# Patient Record
Sex: Female | Born: 1935 | Hispanic: No | Smoking: Never smoker
Health system: Southern US, Community
[De-identification: ages and names within clinical notes are randomized; demographics above are authoritative.]

## PROBLEM LIST (undated history)

## (undated) DIAGNOSIS — M329 Systemic lupus erythematosus, unspecified: Secondary | ICD-10-CM

## (undated) DIAGNOSIS — E785 Hyperlipidemia, unspecified: Secondary | ICD-10-CM

## (undated) DIAGNOSIS — Z950 Presence of cardiac pacemaker: Secondary | ICD-10-CM

## (undated) DIAGNOSIS — I4891 Unspecified atrial fibrillation: Secondary | ICD-10-CM

## (undated) DIAGNOSIS — E049 Nontoxic goiter, unspecified: Secondary | ICD-10-CM

## (undated) DIAGNOSIS — R609 Edema, unspecified: Secondary | ICD-10-CM

## (undated) DIAGNOSIS — D649 Anemia, unspecified: Secondary | ICD-10-CM

## (undated) DIAGNOSIS — I10 Essential (primary) hypertension: Secondary | ICD-10-CM

## (undated) DIAGNOSIS — M109 Gout, unspecified: Secondary | ICD-10-CM

## (undated) DIAGNOSIS — N189 Chronic kidney disease, unspecified: Secondary | ICD-10-CM

## (undated) HISTORY — PX: ABDOMINAL HYSTERECTOMY: SHX81

## (undated) HISTORY — PX: PACEMAKER INSERTION: SHX728

## (undated) HISTORY — PX: JOINT REPLACEMENT: SHX530

---

## 2004-12-25 ENCOUNTER — Emergency Department: Payer: Self-pay | Admitting: Unknown Physician Specialty

## 2009-04-09 ENCOUNTER — Emergency Department: Payer: Self-pay | Admitting: Emergency Medicine

## 2009-04-09 ENCOUNTER — Ambulatory Visit: Payer: Self-pay | Admitting: Family Medicine

## 2009-04-12 ENCOUNTER — Ambulatory Visit: Payer: Self-pay | Admitting: Cardiovascular Disease

## 2009-04-13 ENCOUNTER — Inpatient Hospital Stay: Payer: Self-pay | Admitting: Internal Medicine

## 2013-04-30 DIAGNOSIS — Z950 Presence of cardiac pacemaker: Secondary | ICD-10-CM | POA: Diagnosis not present

## 2013-06-19 DIAGNOSIS — D649 Anemia, unspecified: Secondary | ICD-10-CM | POA: Diagnosis not present

## 2013-06-19 DIAGNOSIS — E785 Hyperlipidemia, unspecified: Secondary | ICD-10-CM | POA: Diagnosis not present

## 2013-06-19 DIAGNOSIS — I10 Essential (primary) hypertension: Secondary | ICD-10-CM | POA: Diagnosis not present

## 2013-06-19 DIAGNOSIS — N289 Disorder of kidney and ureter, unspecified: Secondary | ICD-10-CM | POA: Diagnosis not present

## 2013-06-19 DIAGNOSIS — I442 Atrioventricular block, complete: Secondary | ICD-10-CM | POA: Diagnosis not present

## 2013-07-31 DIAGNOSIS — I495 Sick sinus syndrome: Secondary | ICD-10-CM | POA: Diagnosis not present

## 2013-09-25 DIAGNOSIS — Z1239 Encounter for other screening for malignant neoplasm of breast: Secondary | ICD-10-CM | POA: Diagnosis not present

## 2013-09-25 DIAGNOSIS — Z862 Personal history of diseases of the blood and blood-forming organs and certain disorders involving the immune mechanism: Secondary | ICD-10-CM | POA: Diagnosis not present

## 2013-09-25 DIAGNOSIS — Z23 Encounter for immunization: Secondary | ICD-10-CM | POA: Diagnosis not present

## 2013-09-25 DIAGNOSIS — I10 Essential (primary) hypertension: Secondary | ICD-10-CM | POA: Diagnosis not present

## 2013-09-25 DIAGNOSIS — Z1329 Encounter for screening for other suspected endocrine disorder: Secondary | ICD-10-CM | POA: Diagnosis not present

## 2013-09-25 DIAGNOSIS — Z79899 Other long term (current) drug therapy: Secondary | ICD-10-CM | POA: Diagnosis not present

## 2013-09-25 DIAGNOSIS — D7589 Other specified diseases of blood and blood-forming organs: Secondary | ICD-10-CM | POA: Diagnosis not present

## 2013-09-25 DIAGNOSIS — Z8639 Personal history of other endocrine, nutritional and metabolic disease: Secondary | ICD-10-CM | POA: Diagnosis not present

## 2013-09-25 DIAGNOSIS — Z9189 Other specified personal risk factors, not elsewhere classified: Secondary | ICD-10-CM | POA: Diagnosis not present

## 2013-09-25 DIAGNOSIS — Z130 Encounter for screening for diseases of the blood and blood-forming organs and certain disorders involving the immune mechanism: Secondary | ICD-10-CM | POA: Diagnosis not present

## 2013-09-25 DIAGNOSIS — Z0000 Encounter for general adult medical examination without abnormal findings: Secondary | ICD-10-CM | POA: Diagnosis not present

## 2013-10-14 DIAGNOSIS — M216X9 Other acquired deformities of unspecified foot: Secondary | ICD-10-CM | POA: Diagnosis not present

## 2013-10-14 DIAGNOSIS — E119 Type 2 diabetes mellitus without complications: Secondary | ICD-10-CM | POA: Diagnosis not present

## 2013-10-29 DIAGNOSIS — Z950 Presence of cardiac pacemaker: Secondary | ICD-10-CM | POA: Diagnosis not present

## 2013-11-20 DIAGNOSIS — Z1231 Encounter for screening mammogram for malignant neoplasm of breast: Secondary | ICD-10-CM | POA: Diagnosis not present

## 2013-11-26 DIAGNOSIS — Z23 Encounter for immunization: Secondary | ICD-10-CM | POA: Diagnosis not present

## 2013-12-28 DIAGNOSIS — D7589 Other specified diseases of blood and blood-forming organs: Secondary | ICD-10-CM | POA: Diagnosis not present

## 2013-12-28 DIAGNOSIS — E784 Other hyperlipidemia: Secondary | ICD-10-CM | POA: Diagnosis not present

## 2013-12-28 DIAGNOSIS — N189 Chronic kidney disease, unspecified: Secondary | ICD-10-CM | POA: Diagnosis not present

## 2013-12-28 DIAGNOSIS — I10 Essential (primary) hypertension: Secondary | ICD-10-CM | POA: Diagnosis not present

## 2014-02-10 DIAGNOSIS — Z950 Presence of cardiac pacemaker: Secondary | ICD-10-CM | POA: Diagnosis not present

## 2014-04-26 DIAGNOSIS — D649 Anemia, unspecified: Secondary | ICD-10-CM | POA: Diagnosis not present

## 2014-04-26 DIAGNOSIS — I10 Essential (primary) hypertension: Secondary | ICD-10-CM | POA: Diagnosis not present

## 2014-04-26 DIAGNOSIS — E049 Nontoxic goiter, unspecified: Secondary | ICD-10-CM | POA: Diagnosis not present

## 2014-04-26 DIAGNOSIS — E784 Other hyperlipidemia: Secondary | ICD-10-CM | POA: Diagnosis not present

## 2014-04-26 DIAGNOSIS — Z13228 Encounter for screening for other metabolic disorders: Secondary | ICD-10-CM | POA: Diagnosis not present

## 2014-04-26 DIAGNOSIS — Z79899 Other long term (current) drug therapy: Secondary | ICD-10-CM | POA: Diagnosis not present

## 2014-04-26 DIAGNOSIS — Z0000 Encounter for general adult medical examination without abnormal findings: Secondary | ICD-10-CM | POA: Diagnosis not present

## 2014-05-17 DIAGNOSIS — Z950 Presence of cardiac pacemaker: Secondary | ICD-10-CM | POA: Diagnosis not present

## 2014-08-25 DIAGNOSIS — Z950 Presence of cardiac pacemaker: Secondary | ICD-10-CM | POA: Diagnosis not present

## 2014-11-11 DIAGNOSIS — N183 Chronic kidney disease, stage 3 (moderate): Secondary | ICD-10-CM | POA: Diagnosis not present

## 2014-11-16 DIAGNOSIS — N184 Chronic kidney disease, stage 4 (severe): Secondary | ICD-10-CM | POA: Diagnosis not present

## 2014-11-30 DIAGNOSIS — I495 Sick sinus syndrome: Secondary | ICD-10-CM | POA: Diagnosis not present

## 2015-02-08 DIAGNOSIS — N184 Chronic kidney disease, stage 4 (severe): Secondary | ICD-10-CM | POA: Diagnosis not present

## 2015-03-27 ENCOUNTER — Ambulatory Visit
Admission: EM | Admit: 2015-03-27 | Discharge: 2015-03-27 | Disposition: A | Discharge status: Home / Self Care | Payer: Medicare Other | Attending: Family Medicine | Admitting: Family Medicine

## 2015-03-27 ENCOUNTER — Ambulatory Visit: Payer: Medicare Other

## 2015-03-27 DIAGNOSIS — Z7982 Long term (current) use of aspirin: Secondary | ICD-10-CM | POA: Insufficient documentation

## 2015-03-27 DIAGNOSIS — I509 Heart failure, unspecified: Secondary | ICD-10-CM | POA: Insufficient documentation

## 2015-03-27 DIAGNOSIS — R0602 Shortness of breath: Secondary | ICD-10-CM | POA: Diagnosis present

## 2015-03-27 DIAGNOSIS — I4891 Unspecified atrial fibrillation: Secondary | ICD-10-CM | POA: Diagnosis not present

## 2015-03-27 DIAGNOSIS — I517 Cardiomegaly: Secondary | ICD-10-CM | POA: Diagnosis not present

## 2015-03-27 DIAGNOSIS — Z79899 Other long term (current) drug therapy: Secondary | ICD-10-CM | POA: Insufficient documentation

## 2015-03-27 DIAGNOSIS — J209 Acute bronchitis, unspecified: Secondary | ICD-10-CM | POA: Diagnosis not present

## 2015-03-27 HISTORY — DX: Unspecified atrial fibrillation: I48.91

## 2015-03-27 MED ORDER — AZITHROMYCIN 500 MG PO TABS: ORAL_TABLET | ORAL | Status: AC

## 2015-03-27 MED ORDER — IPRATROPIUM-ALBUTEROL 0.5-2.5 (3) MG/3ML IN SOLN
3.0000 mL | Freq: Once | RESPIRATORY_TRACT | Status: AC
  Administered 2015-03-27: 3 mL via RESPIRATORY_TRACT

## 2015-03-27 MED ORDER — FUROSEMIDE 20 MG PO TABS: 20.0000 mg | ORAL_TABLET | Freq: Every day | ORAL | Status: AC

## 2015-03-27 MED ORDER — ALBUTEROL SULFATE HFA 108 (90 BASE) MCG/ACT IN AERS: 2.0000 | INHALATION_SPRAY | RESPIRATORY_TRACT | Status: AC | PRN

## 2015-03-27 NOTE — ED Notes (Signed)
Patient complains of wheezing that she reports started earlier this week. She states that she has been noticing some chest heaviness. She states that she is not currently having any other symptoms. Patient does currently have a Psychologist, forensic.

## 2015-03-27 NOTE — Discharge Instructions (Signed)
Acute Bronchitis Bronchitis is when the airways that extend from the windpipe into the lungs get red, puffy, and painful (inflamed). Bronchitis often causes thick spit (mucus) to develop. This leads to a cough. A cough is the most common symptom of bronchitis. In acute bronchitis, the condition usually begins suddenly and goes away over time (usually in 2 weeks). Smoking, allergies, and asthma can make bronchitis worse. Repeated episodes of bronchitis may cause more lung problems. HOME CARE  Rest.  Drink enough fluids to keep your pee (urine) clear or pale yellow (unless you need to limit fluids as told by your doctor).  Only take over-the-counter or prescription medicines as told by your doctor.  Avoid smoking and secondhand smoke. These can make bronchitis worse. If you are a smoker, think about using nicotine gum or skin patches. Quitting smoking will help your lungs heal faster.  Reduce the chance of getting bronchitis again by:  Washing your hands often.  Avoiding people with cold symptoms.  Trying not to touch your hands to your mouth, nose, or eyes.  Follow up with your doctor as told. GET HELP IF: Your symptoms do not improve after 1 week of treatment. Symptoms include:  Cough.  Fever.  Coughing up thick spit.  Body aches.  Chest congestion.  Chills.  Shortness of breath.  Sore throat. GET HELP RIGHT AWAY IF:   You have an increased fever.  You have chills.  You have severe shortness of breath.  You have bloody thick spit (sputum).  You throw up (vomit) often.  You lose too much body fluid (dehydration).  You have a severe headache.  You faint. MAKE SURE YOU:   Understand these instructions.  Will watch your condition.  Will get help right away if you are not doing well or get worse.   This information is not intended to replace advice given to you by your health care provider. Make sure you discuss any questions you have with your health care  provider.   Document Released: 07/11/2007 Document Revised: 09/24/2012 Document Reviewed: 07/15/2012 Elsevier Interactive Patient Education 2016 [place].  Heart Failure Heart failure means your heart has trouble pumping blood. This makes it hard for your body to work well. Heart failure is usually a long-term (chronic) condition. You must take good care of yourself and follow your doctor's treatment plan. HOME CARE  Take your heart medicine as told by your doctor.  Do not stop taking medicine unless your doctor tells you to.  Do not skip any dose of medicine.  Refill your medicines before they run out.  Take other medicines only as told by your doctor or pharmacist.  Stay active if told by your doctor. The elderly and people with severe heart failure should talk with a doctor about physical activity.  Eat heart-healthy foods. Choose foods that are without trans fat and are low in saturated fat, cholesterol, and salt (sodium). This includes fresh or frozen fruits and vegetables, fish, lean meats, fat-free or low-fat dairy foods, whole grains, and high-fiber foods. Lentils and dried peas and beans (legumes) are also good choices.  Limit salt if told by your doctor.  Cook in a healthy way. Roast, grill, broil, bake, poach, steam, or stir-fry foods.  Limit fluids as told by your doctor.  Weigh yourself every morning. Do this after you pee (urinate) and before you eat breakfast. Write down your weight to give to your doctor.  Take your blood pressure and write it down if your doctor  tells you to.  Ask your doctor how to check your pulse. Check your pulse as told.  Lose weight if told by your doctor.  Stop smoking or chewing tobacco. Do not use gum or patches that help you quit without your doctor's approval.  Schedule and go to doctor visits as told.  Nonpregnant women should have no more than 1 drink a day. Men should have no more than 2 drinks a day. Talk to your doctor  about drinking alcohol.  Stop illegal drug use.  Stay current with shots (immunizations).  Manage your health conditions as told by your doctor.  Learn to manage your stress.  Rest when you are tired.  If it is really hot outside:  Avoid intense activities.  Use air conditioning or fans, or get in a cooler place.  Avoid caffeine and alcohol.  Wear loose-fitting, lightweight, and light-colored clothing.  If it is really cold outside:  Avoid intense activities.  Layer your clothing.  Wear mittens or gloves, a hat, and a scarf when going outside.  Avoid alcohol.  Learn about heart failure and get support as needed.  Get help to maintain or improve your quality of life and your ability to care for yourself as needed. GET HELP IF:   You gain weight quickly.  You are more short of breath than usual.  You cannot do your normal activities.  You tire easily.  You cough more than normal, especially with activity.  You have any or more puffiness (swelling) in areas such as your hands, feet, ankles, or belly (abdomen).  You cannot sleep because it is hard to breathe.  You feel like your heart is beating fast (palpitations).  You get dizzy or light-headed when you stand up. GET HELP RIGHT AWAY IF:   You have trouble breathing.  There is a change in mental status, such as becoming less alert or not being able to focus.  You have chest pain or discomfort.  You faint. MAKE SURE YOU:   Understand these instructions.  Will watch your condition.  Will get help right away if you are not doing well or get worse.   This information is not intended to replace advice given to you by your health care provider. Make sure you discuss any questions you have with your health care provider.   Document Released: 11/01/2007 Document Revised: 02/12/2014 Document Reviewed: 03/10/2012 Elsevier Interactive Patient Education [employer].

## 2015-03-27 NOTE — ED Provider Notes (Signed)
CSN: [id number]     Arrival date & time 03/27/15  [id number] History   First MD Initiated Contact with Patient 03/27/15 [phone]    Nurses notes were reviewed. Chief Complaint  Patient presents with  . Wheezing    Individual is a 80 year old black female reports being short of breath over the last 6-7 days. She states Monday she started getting short of breath and has been progressively getting worse A lot worse Friday and has continued to get worse since that. She reports difficulty and bleeding getting about with her normal activity. She is does to have a history of COPD but is even though she says she never smoked without she used to smoke many years ago. She does have a history of CHF and she does have a pacemaker at this time. She reports some swelling in the legs but that's appears to be more of a long-term problem and also difficulty with her ambulation. She denies any chest pain but states that her breathing has since seems to be getting worse. She reports some wheezing and some coughing as well.   (Consider location/radiation/quality/duration/timing/severity/associated sxs/prior Treatment) Patient is a 80 y.o. female presenting with shortness of breath. The history is provided by the patient and a relative. No language interpreter was used.  Shortness of Breath Onset quality:  Gradual Duration:  6 days Progression:  Worsening (Last 2 days or since Friday) Chronicity:  Recurrent Context: activity   Context: not smoke exposure and not strong odors   Relieved by:  Nothing Ineffective treatments:  None tried Associated symptoms: cough and wheezing     Past Medical History  Diagnosis Date  . Atrial fibrillation [facility])    Past Surgical History  Procedure Laterality Date  . Pacemaker insertion    . Abdominal hysterectomy     History reviewed. No pertinent family history. Social History  Substance Use Topics  . Smoking status: Never Smoker   . Smokeless tobacco: None  . Alcohol Use: No    OB History    No data available     Review of Systems  Constitutional: Positive for fatigue.  Respiratory: Positive for cough, shortness of breath and wheezing.   Musculoskeletal: Positive for joint swelling and gait problem.  All other systems reviewed and are negative.   Allergies  Review of patient's allergies indicates no known allergies.  Home Medications   Prior to Admission medications   Medication Sig Start Date End Date Taking? Authorizing Provider  allopurinol (ZYLOPRIM) 100 MG tablet Take 100 mg by mouth daily.   Yes Historical Provider, MD  amLODipine (NORVASC) 10 MG tablet Take 10 mg by mouth daily.   Yes Historical Provider, MD  aspirin EC 81 MG tablet Take 81 mg by mouth daily.   Yes Historical Provider, MD  Calcium Carbonate-Vit D-Min (CALCIUM 600 + MINERALS PO) Take by mouth.   Yes Historical Provider, MD  carvedilol (COREG) 6.25 MG tablet Take 6.25 mg by mouth 2 (two) times daily with a meal.   Yes Historical Provider, MD  diltiazem (DILACOR XR) 240 MG 24 hr capsule Take 240 mg by mouth daily.   Yes Historical Provider, MD  pravastatin (PRAVACHOL) 20 MG tablet Take 20 mg by mouth daily.   Yes Historical Provider, MD  albuterol (PROVENTIL HFA;VENTOLIN HFA) 108 (90 Base) MCG/ACT inhaler Inhale 2 puffs into the lungs every 4 (four) hours as needed for wheezing or shortness of breath. 03/27/15   Frederich Cha, MD  azithromycin (ZITHROMAX) 500 MG tablet One tablet  a day x 5 days 03/27/15   Frederich Cha, MD  furosemide (LASIX) 20 MG tablet Take 1 tablet (20 mg total) by mouth daily. 03/27/15   Frederich Cha, MD   Meds Ordered and Administered this Visit   Medications  ipratropium-albuterol (DUONEB) 0.5-2.5 (3) MG/3ML nebulizer solution 3 mL (3 mLs Nebulization Given 03/27/15 0900)    BP 160/55 mmHg  Pulse 64  Temp(Src) 97.4 F (36.3 C) (Oral)  Resp 15  Ht 5\' 7"  (1.702 m)  Wt 219 lb (99.338 kg)  BMI 34.29 kg/m2  SpO2 93% No data found.   Physical Exam   Constitutional: She is oriented to person, place, and time. She appears well-developed and well-nourished.  Some mild distress and discomfort which he tries to be active she is resting comfortably sitting in the chair  HENT:  Head: Atraumatic. Macrocephalic.  Right Ear: Hearing, tympanic membrane and external ear normal.  Left Ear: Hearing, tympanic membrane and external ear normal.  Eyes: Conjunctivae are normal. Pupils are equal, round, and reactive to light.  Neck: Normal range of motion. Neck supple.  Cardiovascular: Exam reveals distant heart sounds.   Pulmonary/Chest: Effort normal. No respiratory distress. She has decreased breath sounds. She has no wheezes.  Musculoskeletal: Normal range of motion.  Neurological: She is alert and oriented to person, place, and time.  Skin: Skin is warm and dry.  Psychiatric: She has a normal mood and affect.  Vitals reviewed.   ED Course  Procedures (including critical care time)  Labs Review Labs Reviewed - No data to display  Imaging Review Dg Chest 2 View  03/27/2015  CLINICAL DATA:  Short of breath for 4 days. History of hypertension, lupus and kidney disease. EXAM: CHEST  2 VIEW COMPARISON:  04/11/2009 FINDINGS: The cardiac silhouette is moderately enlarged. No mediastinal or hilar masses or convincing adenopathy. Mild interstitial prominence with thickening of the fissures. There is some atelectasis or scarring extending laterally from the left hilum. Mild lung base atelectasis. No evidence of pneumonia. No pleural effusion or pneumothorax. Left anterior chest wall single lead pacemaker is well positioned, new since prior exam. There is a mild compression fracture of a lower thoracic vertebra. IMPRESSION: 1. Cardiomegaly with mild interstitial prominence and thickening of the fissures. Mild congestive heart failure suspected. No evidence of pneumonia. Electronically Signed   By: Lajean Manes M.D.   On: 03/27/2015 09:38     Visual Acuity  Review  Right Eye Distance:   Left Eye Distance:   Bilateral Distance:    Right Eye Near:   Left Eye Near:    Bilateral Near:        MDM   1. Bronchitis, acute, with bronchospasm   2. Acute congestive heart failure, unspecified congestive heart failure type ([place])     DuoNeb will be given and chest x-ray will be ordered as well  Patient improved greatly after DuoNeb treatment was given. Chest x-ray showed possible early mild heart failure. At this time appears patient has combination of URI with acute bronchospasms/bronchitis and mild congestive heart failure. She does have a history of heart failure so we're going to place her on Lasix she used to be on basis apparently several years ago. Will give her 2 weeks of Lasix 20 mg 1 tablet a day but she'll need to call her PCP on Monday or Tuesday to be seen and to be evaluated for how long she should take the Lasix medication. For the acute URI/bronchitis with bronchospasm I'm placing her  on Zithromax for 5 days 500 mg and will going to give her an albuterol inhaler since she did respond to the DuoNeb treatment and her history is consistent with a URI. Gone over this twice with her and hopefully to prevent instructions will help her understanding unfortunate time of discharge his symptoms and longer in the room     Note: This dictation was prepared with Dragon dictation along with smaller phrase technology. Any transcriptional errors that result from this process are unintentional.  Frederich Cha, MD 03/27/15 1022

## 2015-04-12 DIAGNOSIS — N184 Chronic kidney disease, stage 4 (severe): Secondary | ICD-10-CM | POA: Diagnosis not present

## 2015-04-12 DIAGNOSIS — I129 Hypertensive chronic kidney disease with stage 1 through stage 4 chronic kidney disease, or unspecified chronic kidney disease: Secondary | ICD-10-CM | POA: Diagnosis not present

## 2015-05-03 DIAGNOSIS — D509 Iron deficiency anemia, unspecified: Secondary | ICD-10-CM | POA: Diagnosis not present

## 2015-05-03 DIAGNOSIS — D649 Anemia, unspecified: Secondary | ICD-10-CM | POA: Diagnosis not present

## 2015-05-26 DIAGNOSIS — I495 Sick sinus syndrome: Secondary | ICD-10-CM | POA: Diagnosis not present

## 2015-05-26 DIAGNOSIS — N185 Chronic kidney disease, stage 5: Secondary | ICD-10-CM | POA: Diagnosis not present

## 2015-06-02 DIAGNOSIS — N185 Chronic kidney disease, stage 5: Secondary | ICD-10-CM | POA: Diagnosis not present

## 2015-06-02 DIAGNOSIS — D631 Anemia in chronic kidney disease: Secondary | ICD-10-CM | POA: Diagnosis not present

## 2015-06-25 ENCOUNTER — Encounter: Payer: Self-pay | Admitting: Gynecology

## 2015-06-25 ENCOUNTER — Ambulatory Visit
Admission: EM | Admit: 2015-06-25 | Discharge: 2015-06-25 | Disposition: A | Discharge status: Home / Self Care | Payer: Medicare Other | Attending: Family Medicine | Admitting: Family Medicine

## 2015-06-25 ENCOUNTER — Ambulatory Visit: Payer: Medicare Other

## 2015-06-25 DIAGNOSIS — D649 Anemia, unspecified: Secondary | ICD-10-CM | POA: Diagnosis not present

## 2015-06-25 DIAGNOSIS — I509 Heart failure, unspecified: Secondary | ICD-10-CM | POA: Insufficient documentation

## 2015-06-25 DIAGNOSIS — M109 Gout, unspecified: Secondary | ICD-10-CM | POA: Insufficient documentation

## 2015-06-25 DIAGNOSIS — E785 Hyperlipidemia, unspecified: Secondary | ICD-10-CM | POA: Insufficient documentation

## 2015-06-25 DIAGNOSIS — Z950 Presence of cardiac pacemaker: Secondary | ICD-10-CM | POA: Insufficient documentation

## 2015-06-25 DIAGNOSIS — I130 Hypertensive heart and chronic kidney disease with heart failure and stage 1 through stage 4 chronic kidney disease, or unspecified chronic kidney disease: Secondary | ICD-10-CM | POA: Insufficient documentation

## 2015-06-25 DIAGNOSIS — N189 Chronic kidney disease, unspecified: Secondary | ICD-10-CM | POA: Insufficient documentation

## 2015-06-25 DIAGNOSIS — I4430 Unspecified atrioventricular block: Secondary | ICD-10-CM | POA: Diagnosis not present

## 2015-06-25 DIAGNOSIS — M329 Systemic lupus erythematosus, unspecified: Secondary | ICD-10-CM | POA: Diagnosis not present

## 2015-06-25 DIAGNOSIS — R609 Edema, unspecified: Secondary | ICD-10-CM | POA: Diagnosis not present

## 2015-06-25 DIAGNOSIS — I10 Essential (primary) hypertension: Secondary | ICD-10-CM | POA: Diagnosis not present

## 2015-06-25 DIAGNOSIS — I4891 Unspecified atrial fibrillation: Secondary | ICD-10-CM | POA: Insufficient documentation

## 2015-06-25 DIAGNOSIS — R0602 Shortness of breath: Secondary | ICD-10-CM | POA: Diagnosis not present

## 2015-06-25 DIAGNOSIS — Z7982 Long term (current) use of aspirin: Secondary | ICD-10-CM | POA: Insufficient documentation

## 2015-06-25 DIAGNOSIS — R0600 Dyspnea, unspecified: Secondary | ICD-10-CM | POA: Diagnosis not present

## 2015-06-25 DIAGNOSIS — J439 Emphysema, unspecified: Secondary | ICD-10-CM | POA: Diagnosis not present

## 2015-06-25 DIAGNOSIS — E875 Hyperkalemia: Secondary | ICD-10-CM | POA: Diagnosis not present

## 2015-06-25 DIAGNOSIS — I5043 Acute on chronic combined systolic (congestive) and diastolic (congestive) heart failure: Secondary | ICD-10-CM | POA: Diagnosis not present

## 2015-06-25 DIAGNOSIS — J9601 Acute respiratory failure with hypoxia: Secondary | ICD-10-CM | POA: Diagnosis not present

## 2015-06-25 DIAGNOSIS — N184 Chronic kidney disease, stage 4 (severe): Secondary | ICD-10-CM | POA: Diagnosis not present

## 2015-06-25 DIAGNOSIS — E049 Nontoxic goiter, unspecified: Secondary | ICD-10-CM | POA: Diagnosis not present

## 2015-06-25 HISTORY — DX: Hyperlipidemia, unspecified: E78.5

## 2015-06-25 HISTORY — DX: Essential (primary) hypertension: I10

## 2015-06-25 HISTORY — DX: Systemic lupus erythematosus, unspecified: M32.9

## 2015-06-25 HISTORY — DX: Gout, unspecified: M10.9

## 2015-06-25 HISTORY — DX: Nontoxic goiter, unspecified: E04.9

## 2015-06-25 HISTORY — DX: Presence of cardiac pacemaker: Z95.0

## 2015-06-25 HISTORY — DX: Chronic kidney disease, unspecified: N18.9

## 2015-06-25 HISTORY — DX: Anemia, unspecified: D64.9

## 2015-06-25 HISTORY — DX: Edema, unspecified: R60.9

## 2015-06-25 NOTE — ED Notes (Signed)
Patient c/o shortness of breath when walking.

## 2015-06-25 NOTE — ED Provider Notes (Signed)
CSN: [id number]     Arrival date & time 06/25/15  1211 History   First MD Initiated Contact with Patient 06/25/15 1240     Chief Complaint  Patient presents with  . Shortness of Breath   (Consider location/radiation/quality/duration/timing/severity/associated sxs/prior Treatment) HPI: Patient presents today with symptoms of shortness of breath with walking. Patient states that she's had the symptoms since yesterday. She does have a history of CHF and CKD. She does have a pacemaker. She denies any chest pain or diaphoresis. She does have lower extremity edema that she feels is worse. She denies any calf tenderness. She denies any history of DVT or PE. She does state that her Lasix dose was decreased last month by her cardiologist.  Past Medical History  Diagnosis Date  . Atrial fibrillation ([place])   . Hypertension   . Anemia   . Gout   . CKD (chronic kidney disease)   . Lupus ([place])   . Hyperlipemia   . Goiter   . Edema   . Cardiac pacemaker    Past Surgical History  Procedure Laterality Date  . Pacemaker insertion    . Abdominal hysterectomy    . Joint replacement Right    No family history on file. Social History  Substance Use Topics  . Smoking status: Never Smoker   . Smokeless tobacco: None  . Alcohol Use: No   OB History    No data available     Review of Systems: Negative except mentioned above.   Allergies  Review of patient's allergies indicates no known allergies.  Home Medications   Prior to Admission medications   Medication Sig Start Date End Date Taking? Authorizing Provider  allopurinol (ZYLOPRIM) 100 MG tablet Take 100 mg by mouth daily.   Yes Historical Provider, MD  amLODipine (NORVASC) 10 MG tablet Take 10 mg by mouth daily.   Yes Historical Provider, MD  aspirin EC 81 MG tablet Take 81 mg by mouth daily.   Yes Historical Provider, MD  Calcium Carbonate-Vit D-Min (CALCIUM 600 + MINERALS PO) Take by mouth.   Yes Historical Provider, MD  carvedilol  (COREG) 6.25 MG tablet Take 6.25 mg by mouth 2 (two) times daily with a meal.   Yes Historical Provider, MD  diltiazem (DILACOR XR) 240 MG 24 hr capsule Take 240 mg by mouth daily.   Yes Historical Provider, MD  furosemide (LASIX) 20 MG tablet Take 1 tablet (20 mg total) by mouth daily. 03/27/15  Yes Frederich Cha, MD  pravastatin (PRAVACHOL) 20 MG tablet Take 20 mg by mouth daily.   Yes Historical Provider, MD  albuterol (PROVENTIL HFA;VENTOLIN HFA) 108 (90 Base) MCG/ACT inhaler Inhale 2 puffs into the lungs every 4 (four) hours as needed for wheezing or shortness of breath. 03/27/15   Frederich Cha, MD  azithromycin [facility]) 500 MG tablet One tablet a day x 5 days 03/27/15   Frederich Cha, MD   Meds Ordered and Administered this Visit  Medications - No data to display  BP 158/60 mmHg  Pulse 60  Temp(Src) 97.6 F (36.4 C) (Oral)  Resp 18  Ht 5\' 7"  (1.702 m)  Wt 219 lb (99.338 kg)  BMI 34.29 kg/m2  SpO2 97% No data found.   Physical Exam   GENERAL: NAD HEENT: no pharyngeal erythema, no exudate, no erythema of TMs, no cervical LAD RESP: slightly decreased breath sounds at bases, no wheezing appreciated, no accessory muscle use  CARD: irregular heart rate  EXTREM: +2 pitting edema lower extemities  bilaterally, -[place] NEURO: CN II-XII grossly intact   ED Course  Procedures (including critical care time)  Labs Review Labs Reviewed - No data to display  Imaging Review No results found.  ECG -pacemaker, no p waves, HR 60, agree with reading on ECG   MDM   A/P: Shortness of breath with history of CHF and CKD- discussed with patient that I would recommend at this time that she go to the ER for further evaluation and treatment. Patient will likely need labs including cardiac labs. Her chest x-ray did not show any acute process related to her CHF however I do feel that she probably needs adjustment in her Lasix given her current symptoms. Her kidney function and potassium will need to be  monitored to do this. Cardiac enzymes will likely need to be done as well. Patient addresses understanding of this and she has someone in the room with her to take her to the ER at this time. She states that she will be going to [facility] because her cardiologist is there.   Paulina Fusi, MD 06/25/15 1343

## 2015-06-26 DIAGNOSIS — D631 Anemia in chronic kidney disease: Secondary | ICD-10-CM | POA: Diagnosis present

## 2015-06-26 DIAGNOSIS — M329 Systemic lupus erythematosus, unspecified: Secondary | ICD-10-CM | POA: Diagnosis present

## 2015-06-26 DIAGNOSIS — E875 Hyperkalemia: Secondary | ICD-10-CM | POA: Diagnosis not present

## 2015-06-26 DIAGNOSIS — I480 Paroxysmal atrial fibrillation: Secondary | ICD-10-CM | POA: Diagnosis present

## 2015-06-26 DIAGNOSIS — Z862 Personal history of diseases of the blood and blood-forming organs and certain disorders involving the immune mechanism: Secondary | ICD-10-CM | POA: Diagnosis not present

## 2015-06-26 DIAGNOSIS — Z6834 Body mass index (BMI) 34.0-34.9, adult: Secondary | ICD-10-CM | POA: Diagnosis not present

## 2015-06-26 DIAGNOSIS — M109 Gout, unspecified: Secondary | ICD-10-CM | POA: Diagnosis present

## 2015-06-26 DIAGNOSIS — I130 Hypertensive heart and chronic kidney disease with heart failure and stage 1 through stage 4 chronic kidney disease, or unspecified chronic kidney disease: Secondary | ICD-10-CM | POA: Diagnosis present

## 2015-06-26 DIAGNOSIS — Z7982 Long term (current) use of aspirin: Secondary | ICD-10-CM | POA: Diagnosis not present

## 2015-06-26 DIAGNOSIS — Z90710 Acquired absence of both cervix and uterus: Secondary | ICD-10-CM | POA: Diagnosis not present

## 2015-06-26 DIAGNOSIS — E785 Hyperlipidemia, unspecified: Secondary | ICD-10-CM | POA: Diagnosis present

## 2015-06-26 DIAGNOSIS — N184 Chronic kidney disease, stage 4 (severe): Secondary | ICD-10-CM | POA: Diagnosis present

## 2015-06-26 DIAGNOSIS — R0602 Shortness of breath: Secondary | ICD-10-CM | POA: Diagnosis not present

## 2015-06-26 DIAGNOSIS — Z7901 Long term (current) use of anticoagulants: Secondary | ICD-10-CM | POA: Diagnosis not present

## 2015-06-26 DIAGNOSIS — J9601 Acute respiratory failure with hypoxia: Secondary | ICD-10-CM | POA: Diagnosis not present

## 2015-06-26 DIAGNOSIS — I5043 Acute on chronic combined systolic (congestive) and diastolic (congestive) heart failure: Secondary | ICD-10-CM | POA: Diagnosis present

## 2015-06-26 DIAGNOSIS — E049 Nontoxic goiter, unspecified: Secondary | ICD-10-CM | POA: Diagnosis present

## 2015-06-26 DIAGNOSIS — R609 Edema, unspecified: Secondary | ICD-10-CM | POA: Diagnosis not present

## 2015-06-26 DIAGNOSIS — I10 Essential (primary) hypertension: Secondary | ICD-10-CM | POA: Diagnosis not present

## 2015-06-26 DIAGNOSIS — I872 Venous insufficiency (chronic) (peripheral): Secondary | ICD-10-CM | POA: Diagnosis present

## 2015-06-26 DIAGNOSIS — I509 Heart failure, unspecified: Secondary | ICD-10-CM | POA: Diagnosis not present

## 2015-06-26 DIAGNOSIS — D649 Anemia, unspecified: Secondary | ICD-10-CM | POA: Diagnosis not present

## 2015-06-26 DIAGNOSIS — Z950 Presence of cardiac pacemaker: Secondary | ICD-10-CM | POA: Diagnosis not present

## 2015-06-26 DIAGNOSIS — D469 Myelodysplastic syndrome, unspecified: Secondary | ICD-10-CM | POA: Diagnosis present

## 2015-07-05 DIAGNOSIS — I129 Hypertensive chronic kidney disease with stage 1 through stage 4 chronic kidney disease, or unspecified chronic kidney disease: Secondary | ICD-10-CM | POA: Diagnosis not present

## 2015-07-05 DIAGNOSIS — N189 Chronic kidney disease, unspecified: Secondary | ICD-10-CM | POA: Diagnosis not present

## 2015-07-05 DIAGNOSIS — D638 Anemia in other chronic diseases classified elsewhere: Secondary | ICD-10-CM | POA: Diagnosis not present

## 2015-07-05 DIAGNOSIS — I5041 Acute combined systolic (congestive) and diastolic (congestive) heart failure: Secondary | ICD-10-CM | POA: Diagnosis not present

## 2015-07-05 DIAGNOSIS — N184 Chronic kidney disease, stage 4 (severe): Secondary | ICD-10-CM | POA: Diagnosis not present

## 2015-07-05 DIAGNOSIS — Z09 Encounter for follow-up examination after completed treatment for conditions other than malignant neoplasm: Secondary | ICD-10-CM | POA: Diagnosis not present

## 2015-07-05 DIAGNOSIS — I10 Essential (primary) hypertension: Secondary | ICD-10-CM | POA: Diagnosis not present

## 2015-07-12 DIAGNOSIS — I480 Paroxysmal atrial fibrillation: Secondary | ICD-10-CM | POA: Diagnosis not present

## 2015-07-12 DIAGNOSIS — I10 Essential (primary) hypertension: Secondary | ICD-10-CM | POA: Diagnosis not present

## 2015-07-12 DIAGNOSIS — N181 Chronic kidney disease, stage 1: Secondary | ICD-10-CM | POA: Diagnosis not present

## 2015-07-12 DIAGNOSIS — I5023 Acute on chronic systolic (congestive) heart failure: Secondary | ICD-10-CM | POA: Diagnosis not present

## 2015-08-02 DIAGNOSIS — R7989 Other specified abnormal findings of blood chemistry: Secondary | ICD-10-CM | POA: Diagnosis not present

## 2015-08-02 DIAGNOSIS — I120 Hypertensive chronic kidney disease with stage 5 chronic kidney disease or end stage renal disease: Secondary | ICD-10-CM | POA: Diagnosis not present

## 2015-08-02 DIAGNOSIS — N185 Chronic kidney disease, stage 5: Secondary | ICD-10-CM | POA: Diagnosis not present

## 2015-08-10 DIAGNOSIS — R7989 Other specified abnormal findings of blood chemistry: Secondary | ICD-10-CM | POA: Diagnosis not present

## 2015-08-19 DIAGNOSIS — I5043 Acute on chronic combined systolic (congestive) and diastolic (congestive) heart failure: Secondary | ICD-10-CM | POA: Diagnosis not present

## 2015-08-19 DIAGNOSIS — Z950 Presence of cardiac pacemaker: Secondary | ICD-10-CM | POA: Diagnosis not present

## 2015-08-19 DIAGNOSIS — I5023 Acute on chronic systolic (congestive) heart failure: Secondary | ICD-10-CM | POA: Diagnosis not present

## 2015-08-19 DIAGNOSIS — I4430 Unspecified atrioventricular block: Secondary | ICD-10-CM | POA: Diagnosis not present

## 2015-08-19 DIAGNOSIS — N181 Chronic kidney disease, stage 1: Secondary | ICD-10-CM | POA: Diagnosis not present

## 2015-08-19 DIAGNOSIS — I10 Essential (primary) hypertension: Secondary | ICD-10-CM | POA: Diagnosis not present

## 2015-08-19 DIAGNOSIS — I872 Venous insufficiency (chronic) (peripheral): Secondary | ICD-10-CM | POA: Diagnosis not present

## 2015-08-19 DIAGNOSIS — I480 Paroxysmal atrial fibrillation: Secondary | ICD-10-CM | POA: Diagnosis not present

## 2015-09-09 DIAGNOSIS — I4430 Unspecified atrioventricular block: Secondary | ICD-10-CM | POA: Diagnosis not present

## 2015-09-20 DIAGNOSIS — N185 Chronic kidney disease, stage 5: Secondary | ICD-10-CM | POA: Diagnosis not present

## 2015-09-21 DIAGNOSIS — D649 Anemia, unspecified: Secondary | ICD-10-CM | POA: Diagnosis not present

## 2015-10-07 DIAGNOSIS — Z1329 Encounter for screening for other suspected endocrine disorder: Secondary | ICD-10-CM | POA: Diagnosis not present

## 2015-10-07 DIAGNOSIS — Z0000 Encounter for general adult medical examination without abnormal findings: Secondary | ICD-10-CM | POA: Diagnosis not present

## 2015-10-07 DIAGNOSIS — E785 Hyperlipidemia, unspecified: Secondary | ICD-10-CM | POA: Diagnosis not present

## 2015-10-07 DIAGNOSIS — Z23 Encounter for immunization: Secondary | ICD-10-CM | POA: Diagnosis not present

## 2015-10-11 DIAGNOSIS — N184 Chronic kidney disease, stage 4 (severe): Secondary | ICD-10-CM | POA: Diagnosis not present

## 2015-10-14 DIAGNOSIS — N184 Chronic kidney disease, stage 4 (severe): Secondary | ICD-10-CM | POA: Diagnosis not present

## 2015-10-14 DIAGNOSIS — D649 Anemia, unspecified: Secondary | ICD-10-CM | POA: Diagnosis not present

## 2015-10-14 DIAGNOSIS — D631 Anemia in chronic kidney disease: Secondary | ICD-10-CM | POA: Diagnosis not present

## 2015-10-14 DIAGNOSIS — Z1231 Encounter for screening mammogram for malignant neoplasm of breast: Secondary | ICD-10-CM | POA: Diagnosis not present

## 2015-11-11 DIAGNOSIS — Z86010 Personal history of colonic polyps: Secondary | ICD-10-CM | POA: Diagnosis not present

## 2015-11-11 DIAGNOSIS — Z538 Procedure and treatment not carried out for other reasons: Secondary | ICD-10-CM | POA: Diagnosis not present

## 2015-11-11 DIAGNOSIS — I130 Hypertensive heart and chronic kidney disease with heart failure and stage 1 through stage 4 chronic kidney disease, or unspecified chronic kidney disease: Secondary | ICD-10-CM | POA: Diagnosis not present

## 2015-11-11 DIAGNOSIS — E785 Hyperlipidemia, unspecified: Secondary | ICD-10-CM | POA: Diagnosis not present

## 2015-11-11 DIAGNOSIS — Z1211 Encounter for screening for malignant neoplasm of colon: Secondary | ICD-10-CM | POA: Diagnosis not present

## 2015-11-11 DIAGNOSIS — Z79899 Other long term (current) drug therapy: Secondary | ICD-10-CM | POA: Diagnosis not present

## 2015-11-11 DIAGNOSIS — I509 Heart failure, unspecified: Secondary | ICD-10-CM | POA: Diagnosis not present

## 2015-11-11 DIAGNOSIS — Z7982 Long term (current) use of aspirin: Secondary | ICD-10-CM | POA: Diagnosis not present

## 2015-11-11 DIAGNOSIS — N189 Chronic kidney disease, unspecified: Secondary | ICD-10-CM | POA: Diagnosis not present

## 2015-12-09 DIAGNOSIS — I4430 Unspecified atrioventricular block: Secondary | ICD-10-CM | POA: Diagnosis not present

## 2016-01-06 DIAGNOSIS — I4891 Unspecified atrial fibrillation: Secondary | ICD-10-CM | POA: Diagnosis not present

## 2016-01-06 DIAGNOSIS — E049 Nontoxic goiter, unspecified: Secondary | ICD-10-CM | POA: Diagnosis not present

## 2016-01-06 DIAGNOSIS — D638 Anemia in other chronic diseases classified elsewhere: Secondary | ICD-10-CM | POA: Diagnosis not present

## 2016-01-06 DIAGNOSIS — I10 Essential (primary) hypertension: Secondary | ICD-10-CM | POA: Diagnosis not present

## 2016-01-06 DIAGNOSIS — E785 Hyperlipidemia, unspecified: Secondary | ICD-10-CM | POA: Diagnosis not present

## 2016-01-06 DIAGNOSIS — Z23 Encounter for immunization: Secondary | ICD-10-CM | POA: Diagnosis not present

## 2016-01-06 DIAGNOSIS — I129 Hypertensive chronic kidney disease with stage 1 through stage 4 chronic kidney disease, or unspecified chronic kidney disease: Secondary | ICD-10-CM | POA: Diagnosis not present

## 2016-01-06 DIAGNOSIS — N184 Chronic kidney disease, stage 4 (severe): Secondary | ICD-10-CM | POA: Diagnosis not present

## 2016-03-12 DIAGNOSIS — I5023 Acute on chronic systolic (congestive) heart failure: Secondary | ICD-10-CM | POA: Diagnosis not present

## 2016-03-13 DIAGNOSIS — I151 Hypertension secondary to other renal disorders: Secondary | ICD-10-CM | POA: Diagnosis not present

## 2016-03-13 DIAGNOSIS — N2889 Other specified disorders of kidney and ureter: Secondary | ICD-10-CM | POA: Diagnosis not present

## 2016-05-11 DIAGNOSIS — D638 Anemia in other chronic diseases classified elsewhere: Secondary | ICD-10-CM | POA: Diagnosis not present

## 2016-05-11 DIAGNOSIS — E049 Nontoxic goiter, unspecified: Secondary | ICD-10-CM | POA: Diagnosis not present

## 2016-05-11 DIAGNOSIS — D649 Anemia, unspecified: Secondary | ICD-10-CM | POA: Diagnosis not present

## 2016-05-11 DIAGNOSIS — E784 Other hyperlipidemia: Secondary | ICD-10-CM | POA: Diagnosis not present

## 2016-05-11 DIAGNOSIS — I4891 Unspecified atrial fibrillation: Secondary | ICD-10-CM | POA: Diagnosis not present

## 2016-05-11 DIAGNOSIS — I10 Essential (primary) hypertension: Secondary | ICD-10-CM | POA: Diagnosis not present

## 2016-07-10 DIAGNOSIS — N184 Chronic kidney disease, stage 4 (severe): Secondary | ICD-10-CM | POA: Diagnosis not present

## 2016-09-06 DIAGNOSIS — Z45018 Encounter for adjustment and management of other part of cardiac pacemaker: Secondary | ICD-10-CM | POA: Diagnosis not present

## 2016-09-06 DIAGNOSIS — I495 Sick sinus syndrome: Secondary | ICD-10-CM | POA: Diagnosis not present

## 2016-12-04 DIAGNOSIS — I129 Hypertensive chronic kidney disease with stage 1 through stage 4 chronic kidney disease, or unspecified chronic kidney disease: Secondary | ICD-10-CM | POA: Diagnosis not present

## 2016-12-04 DIAGNOSIS — N184 Chronic kidney disease, stage 4 (severe): Secondary | ICD-10-CM | POA: Diagnosis not present

## 2016-12-06 DIAGNOSIS — Z45018 Encounter for adjustment and management of other part of cardiac pacemaker: Secondary | ICD-10-CM | POA: Diagnosis not present

## 2016-12-06 DIAGNOSIS — I495 Sick sinus syndrome: Secondary | ICD-10-CM | POA: Diagnosis not present

## 2016-12-06 DIAGNOSIS — Z950 Presence of cardiac pacemaker: Secondary | ICD-10-CM | POA: Diagnosis not present

## 2016-12-14 DIAGNOSIS — N184 Chronic kidney disease, stage 4 (severe): Secondary | ICD-10-CM | POA: Diagnosis not present

## 2016-12-14 DIAGNOSIS — D631 Anemia in chronic kidney disease: Secondary | ICD-10-CM | POA: Diagnosis not present

## 2016-12-18 DIAGNOSIS — Z23 Encounter for immunization: Secondary | ICD-10-CM | POA: Diagnosis not present

## 2016-12-18 DIAGNOSIS — R200 Anesthesia of skin: Secondary | ICD-10-CM | POA: Diagnosis not present

## 2016-12-18 DIAGNOSIS — E139 Other specified diabetes mellitus without complications: Secondary | ICD-10-CM | POA: Diagnosis not present

## 2017-01-23 DIAGNOSIS — D631 Anemia in chronic kidney disease: Secondary | ICD-10-CM | POA: Diagnosis not present

## 2017-01-23 DIAGNOSIS — N184 Chronic kidney disease, stage 4 (severe): Secondary | ICD-10-CM | POA: Diagnosis not present

## 2017-01-25 DIAGNOSIS — N184 Chronic kidney disease, stage 4 (severe): Secondary | ICD-10-CM | POA: Diagnosis not present

## 2017-01-25 DIAGNOSIS — D631 Anemia in chronic kidney disease: Secondary | ICD-10-CM | POA: Diagnosis not present

## 2018-01-05 DEATH — deceased

## 2018-02-06 IMAGING — CR DG CHEST 2V
3 series · 3 of 3 positions shown · non-contrast
Comparison: 04/11/2009

CLINICAL DATA: Short of breath for 4 days. History of hypertension,
lupus and kidney disease.

EXAM:
CHEST  2 VIEW

[chest lat (1 of 2)]
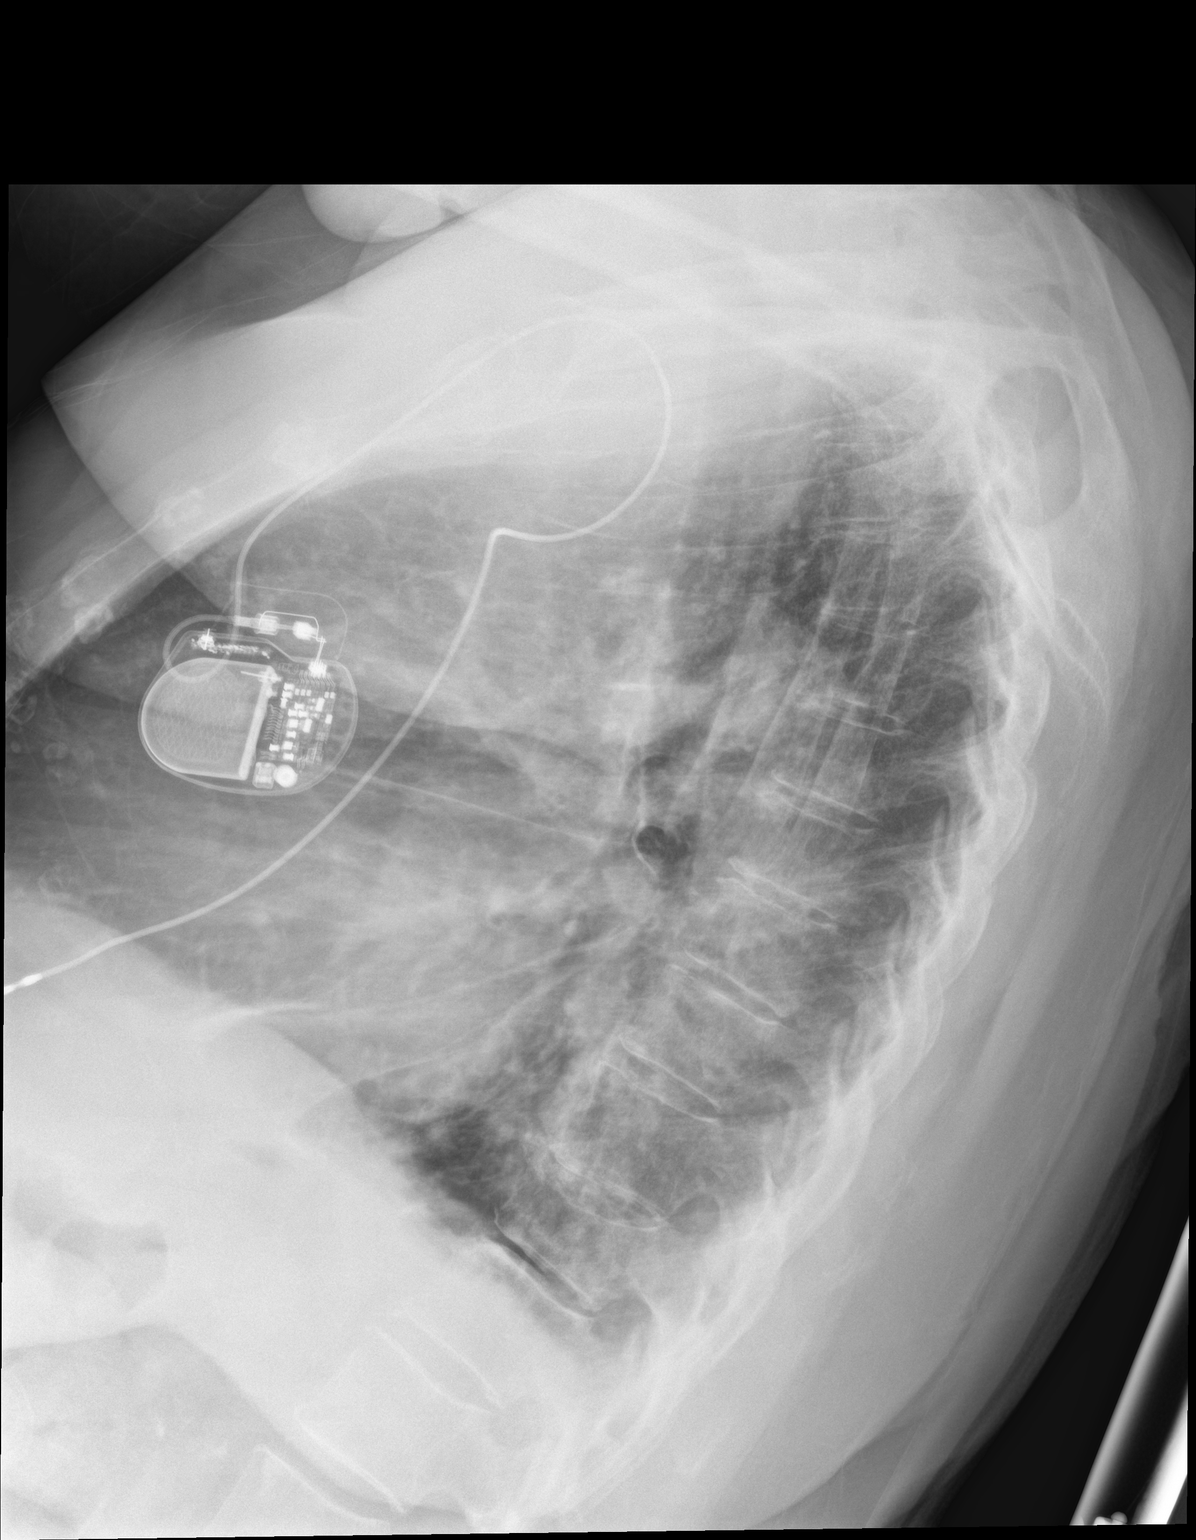

[chest ap]
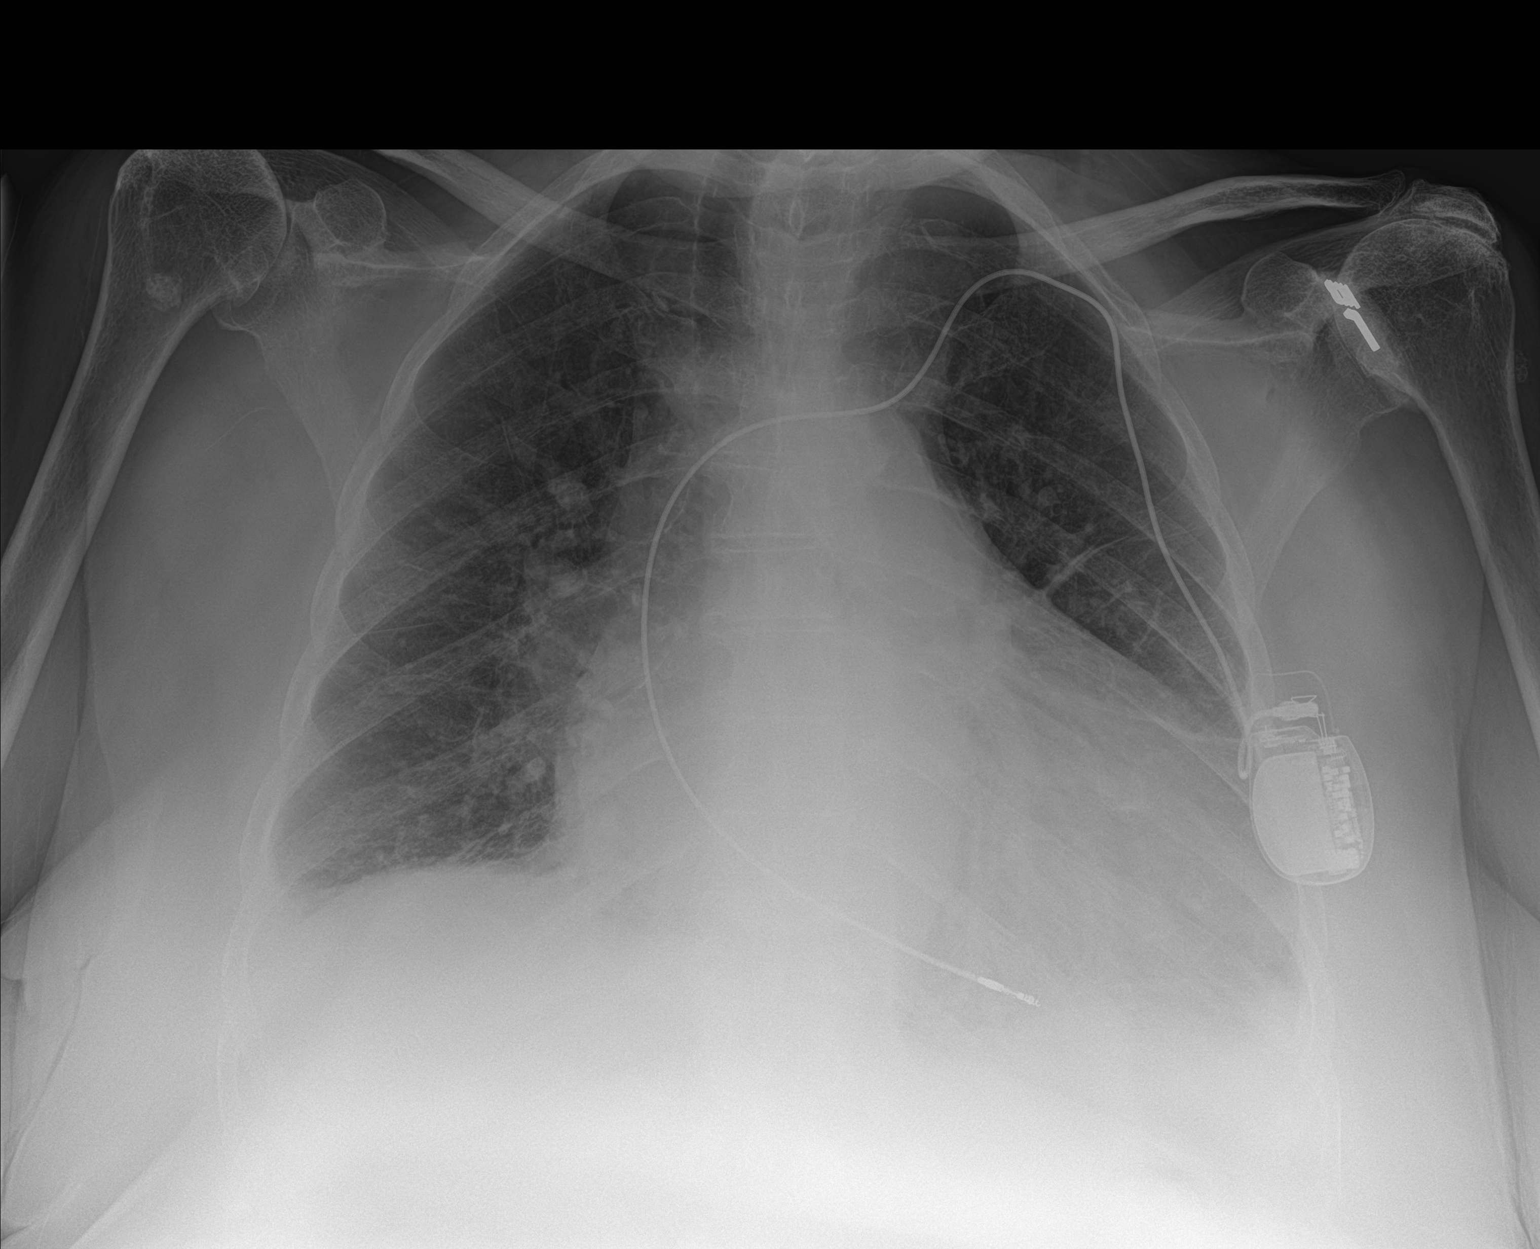

[chest lat (2 of 2)]
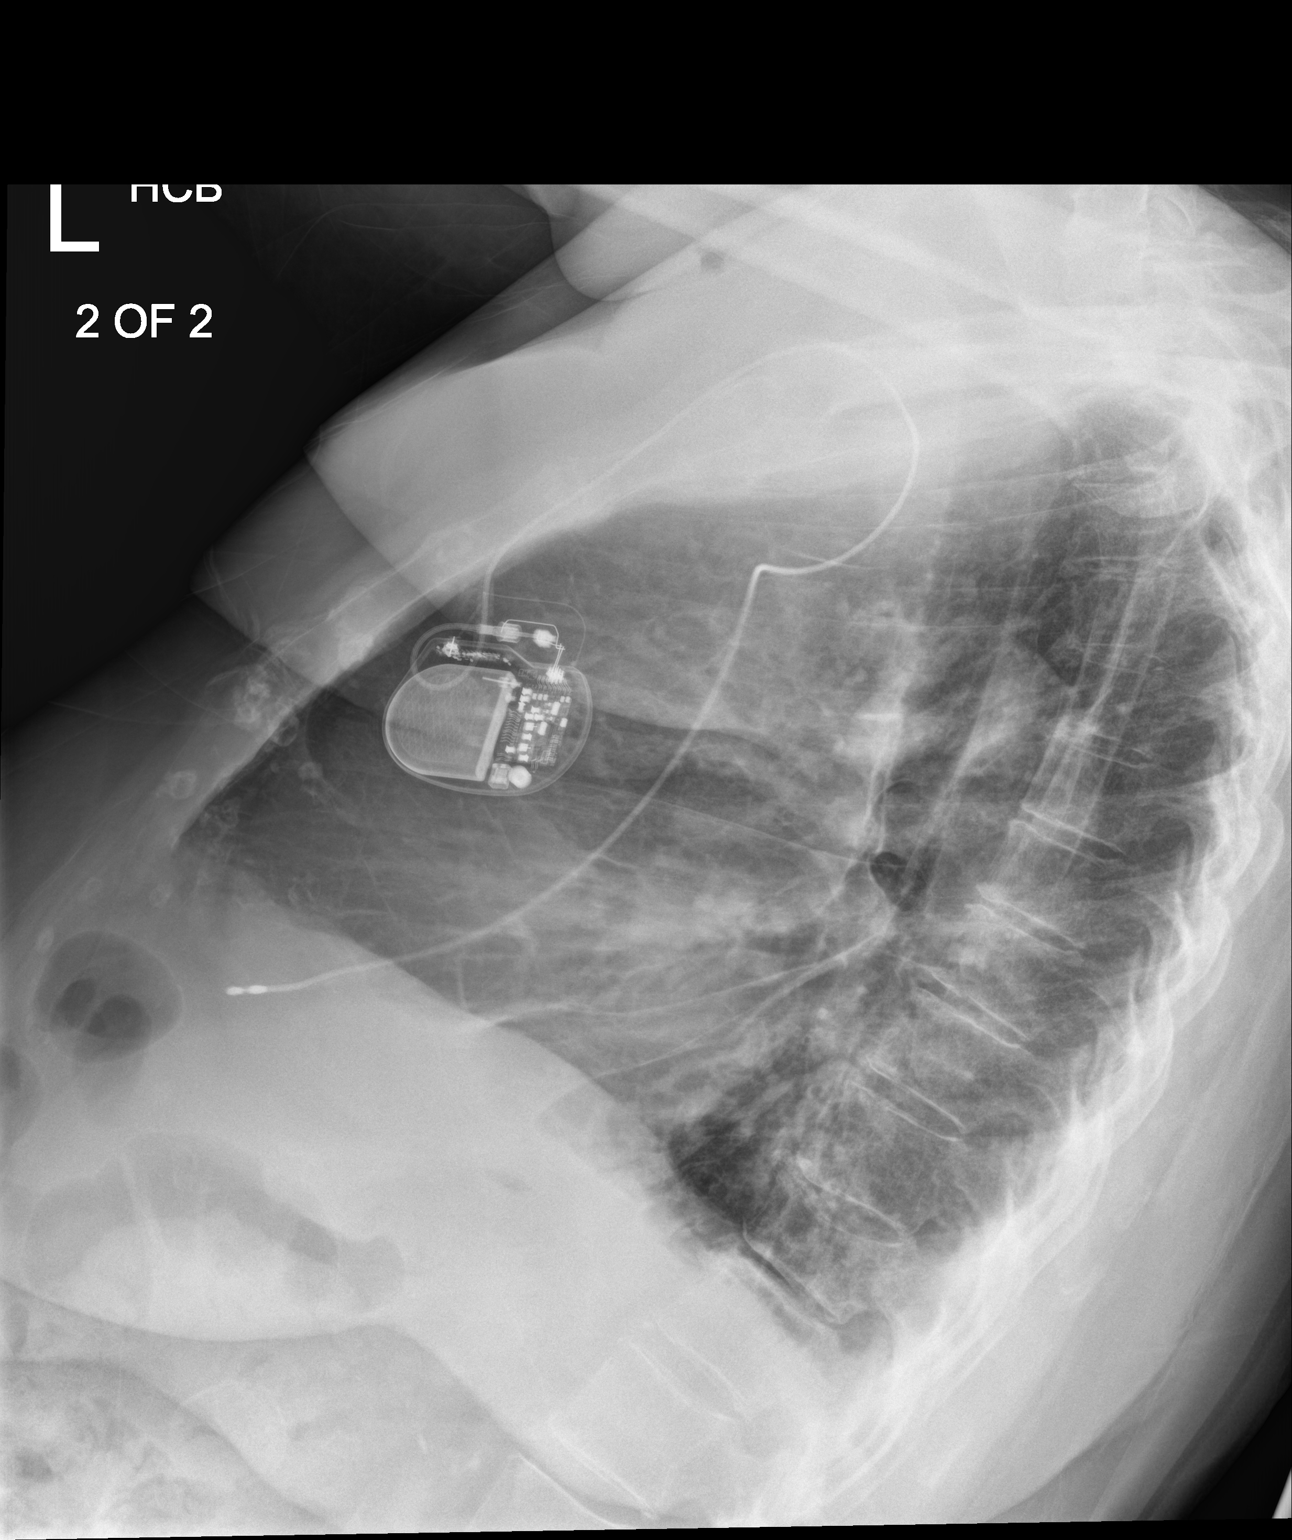

[3 of 3 positions shown; findings below may reference images not displayed]

FINDINGS: The cardiac silhouette is moderately enlarged. No mediastinal or
hilar masses or convincing adenopathy.

Mild interstitial prominence with thickening of the fissures. There
is some atelectasis or scarring extending laterally from the left
hilum. Mild lung base atelectasis. No evidence of pneumonia. No
pleural effusion or pneumothorax.

Left anterior chest wall single lead pacemaker is well positioned,
new since prior exam. There is a mild compression fracture of a
lower thoracic vertebra.
IMPRESSION: 1. Cardiomegaly with mild interstitial prominence and thickening of
the fissures. Mild congestive heart failure suspected. No evidence
of pneumonia.

## 2018-05-07 IMAGING — CR DG CHEST 2V
2 series · 2 of 2 positions shown · non-contrast
Comparison: 03/27/2015

CLINICAL DATA: 80-year-old female with a history of shortness of
breath

EXAM:
CHEST  2 VIEW

[chest pa]
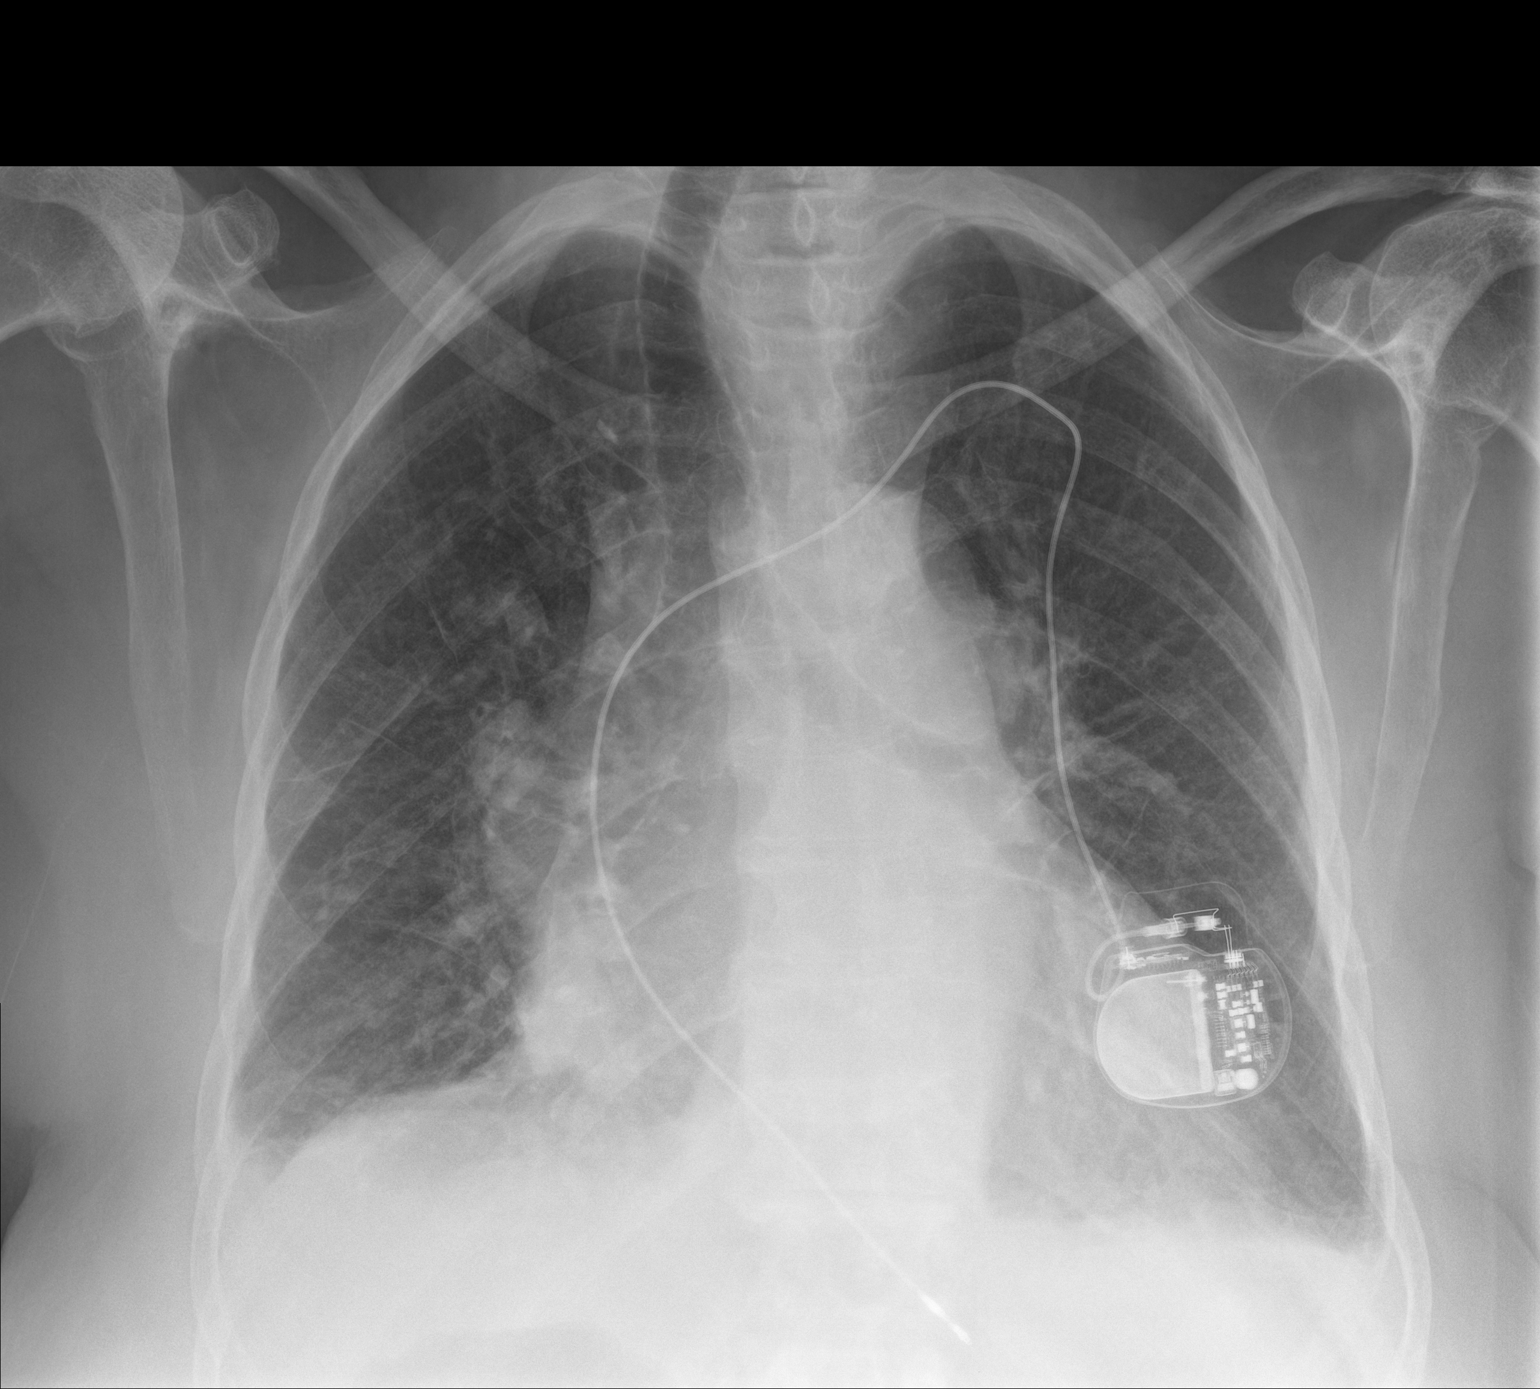

[chest ap]
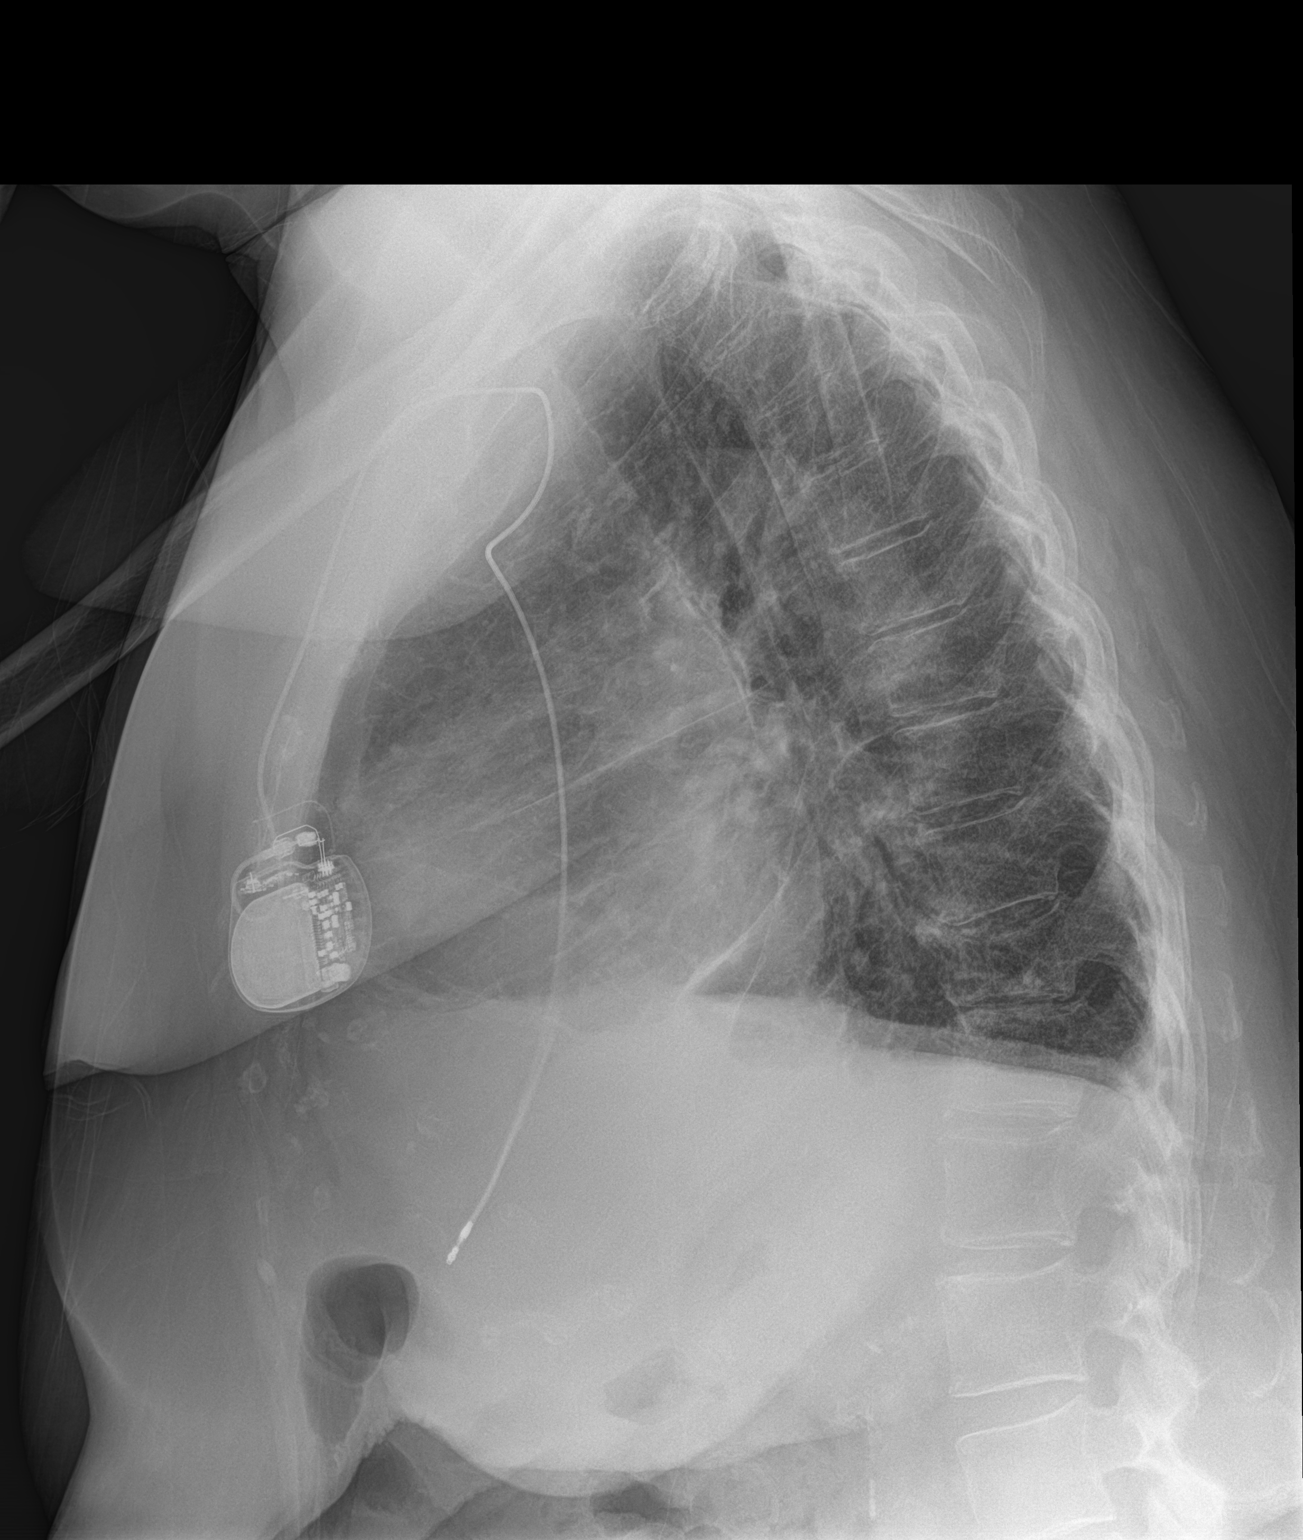

[2 of 2 positions shown; findings below may reference images not displayed]

FINDINGS: Cardiomediastinal silhouette unchanged with cardiomegaly.

Unchanged position of left chest wall cardiac pacing device.

No confluent airspace disease. No pneumothorax. No pleural effusion.

Stigmata of emphysema, with increased retrosternal airspace,
flattened hemidiaphragms, increased AP diameter, and hyperinflation
on the AP view.

Chronic lung changes.
IMPRESSION: Chronic lung changes and emphysema, with no evidence of acute
cardiopulmonary disease.

Unchanged cardiac pacing device.
